# Patient Record
Sex: Female | Born: 2005 | Hispanic: Yes | Marital: Single | Smoking: Never smoker
Health system: Southern US, Community
[De-identification: ages and names within clinical notes are randomized; demographics above are authoritative.]

## PROBLEM LIST (undated history)

## (undated) DIAGNOSIS — J45909 Unspecified asthma, uncomplicated: Secondary | ICD-10-CM

---
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
## 2024-05-20 ENCOUNTER — Other Ambulatory Visit: Payer: Self-pay

## 2024-05-20 ENCOUNTER — Encounter (HOSPITAL_BASED_OUTPATIENT_CLINIC_OR_DEPARTMENT_OTHER): Payer: Self-pay

## 2024-05-20 DIAGNOSIS — Z5321 Procedure and treatment not carried out due to patient leaving prior to being seen by health care provider: Secondary | ICD-10-CM | POA: Insufficient documentation

## 2024-05-20 DIAGNOSIS — H5711 Ocular pain, right eye: Secondary | ICD-10-CM | POA: Insufficient documentation

## 2024-05-20 DIAGNOSIS — R519 Headache, unspecified: Secondary | ICD-10-CM | POA: Insufficient documentation

## 2024-05-20 MED ORDER — TETRACAINE HCL 0.5 % OP SOLN: 2.0000 [drp] | Freq: Once | OPHTHALMIC | Status: DC

## 2024-05-20 MED ORDER — FLUORESCEIN SODIUM 1 MG OP STRP: 1.0000 | ORAL_STRIP | Freq: Once | OPHTHALMIC | Status: DC

## 2024-05-20 NOTE — ED Triage Notes (Signed)
 Complains of R side of face and R eye pain starting this morning. Taken tylenol  without relief. States it feel like the pain of a headache but worse. Denies changes in vision, drainage, fevers, trauma to eye/ face.

## 2024-05-21 ENCOUNTER — Encounter (HOSPITAL_BASED_OUTPATIENT_CLINIC_OR_DEPARTMENT_OTHER): Payer: Self-pay | Admitting: Emergency Medicine

## 2024-05-21 ENCOUNTER — Emergency Department (HOSPITAL_BASED_OUTPATIENT_CLINIC_OR_DEPARTMENT_OTHER)
Admission: EM | Admit: 2024-05-21 | Discharge: 2024-05-21 | Discharge status: Against Medical Advice | Attending: Emergency Medicine | Admitting: Emergency Medicine

## 2024-05-21 ENCOUNTER — Emergency Department (HOSPITAL_COMMUNITY)
Admission: EM | Admit: 2024-05-21 | Discharge: 2024-05-21 | Disposition: A | Discharge status: Home / Self Care | Source: Home / Self Care | Attending: Emergency Medicine | Admitting: Emergency Medicine

## 2024-05-21 ENCOUNTER — Emergency Department (HOSPITAL_COMMUNITY)

## 2024-05-21 ENCOUNTER — Other Ambulatory Visit: Payer: Self-pay

## 2024-05-21 DIAGNOSIS — R519 Headache, unspecified: Secondary | ICD-10-CM | POA: Insufficient documentation

## 2024-05-21 DIAGNOSIS — Z5321 Procedure and treatment not carried out due to patient leaving prior to being seen by health care provider: Secondary | ICD-10-CM | POA: Diagnosis not present

## 2024-05-21 DIAGNOSIS — H5711 Ocular pain, right eye: Secondary | ICD-10-CM | POA: Diagnosis present

## 2024-05-21 DIAGNOSIS — H539 Unspecified visual disturbance: Secondary | ICD-10-CM | POA: Insufficient documentation

## 2024-05-21 MED ORDER — GADOBUTROL 1 MMOL/ML IV SOLN
4.5000 mL | Freq: Once | INTRAVENOUS | Status: AC | PRN
  Administered 2024-05-21: 4.5 mL via INTRAVENOUS

## 2024-05-21 MED ORDER — OXYCODONE-ACETAMINOPHEN 5-325 MG PO TABS
1.0000 | ORAL_TABLET | Freq: Once | ORAL | Status: DC
  Filled 2024-05-21: qty 1

## 2024-05-21 MED ORDER — PREDNISOLONE ACETATE 1 % OP SUSP: 1.0000 [drp] | Freq: Four times a day (QID) | OPHTHALMIC | 0 refills | Status: AC

## 2024-05-21 MED ORDER — FLUORESCEIN SODIUM 1 MG OP STRP
1.0000 | ORAL_STRIP | Freq: Once | OPHTHALMIC | Status: AC
  Administered 2024-05-21: 1 via OPHTHALMIC
  Filled 2024-05-21: qty 1

## 2024-05-21 MED ORDER — KETOROLAC TROMETHAMINE 30 MG/ML IJ SOLN
30.0000 mg | Freq: Once | INTRAMUSCULAR | Status: AC
  Administered 2024-05-21: 30 mg via INTRAMUSCULAR
  Filled 2024-05-21: qty 1

## 2024-05-21 MED ORDER — TETRACAINE HCL 0.5 % OP SOLN
1.0000 [drp] | Freq: Once | OPHTHALMIC | Status: AC
  Administered 2024-05-21: 1 [drp] via OPHTHALMIC
  Filled 2024-05-21: qty 4

## 2024-05-21 MED ORDER — LORAZEPAM 1 MG PO TABS: 0.5000 mg | ORAL_TABLET | ORAL | Status: DC | PRN

## 2024-05-21 MED ORDER — ACETAMINOPHEN 325 MG PO TABS
650.0000 mg | ORAL_TABLET | Freq: Once | ORAL | Status: AC
  Administered 2024-05-21: 650 mg via ORAL
  Filled 2024-05-21: qty 2

## 2024-05-21 NOTE — ED Notes (Signed)
 ED Provider at bedside.

## 2024-05-21 NOTE — ED Notes (Addendum)
 Pt told this RN that she would go POV to hospital. Verified w MD, pt is okay to go POV. [facility] notified. Pt given address for [name] and instructions for arrival. Educated to remain NPO and go straight to hospital. [facility] ED Charge RN called for update.

## 2024-05-21 NOTE — ED Notes (Signed)
 Patient transported to MRI

## 2024-05-21 NOTE — ED Notes (Signed)
 Report given to [place] charge from charge here at [facility]

## 2024-05-21 NOTE — ED Provider Notes (Signed)
 [place] EMERGENCY DEPARTMENT AT [facility] Provider Note   CSN: [id number] Arrival date & time: 05/21/24  0041     Patient presents with: Facial Pain   [name] is a 18 y.o. female.   HPI   18 year old female presenting to the emergency department with right-sided facial pain.  The patient has had pain to the right side of her face all day.  She denies any trauma to the face.  She denies any purulent discharge from her sinuses, denies any sinus pressure.  She denies any fevers or chills.  She denies headache initially, endorses mild headache in the setting of pain around her right eye.  She denies any rash.  She endorses blurry vision, denies double vision or vision loss.  She has had some tearing of the right eye with associated blurred vision.  No trauma to the eye. She endorses pain which feels to be behind the eye. Earlier she also had pain with extraocular movements.  Prior to Admission medications   Medication Sig Start Date End Date Taking? Authorizing Provider  ondansetron  (ZOFRAN  ODT) 4 MG disintegrating tablet Take 1 tablet (4 mg total) by mouth every 8 (eight) hours as needed. 01/08/20   Ettie Gull, MD  polyethylene glycol powder (GLYCOLAX /MIRALAX ) 17 GM/SCOOP powder 1 cap in 8 ounces daily 01/08/20   Ettie Gull, MD    Allergies: Patient has no known allergies.    Review of Systems  All other systems reviewed and are negative.   Updated Vital Signs BP (!) 97/55 (BP Location: Right Arm)   Pulse 63   Temp 98.1 F (36.7 C) (Oral)   Resp 16   Ht 5' 1 (1.549 m)   Wt 46.7 kg   LMP 05/20/2024 (Exact Date)   SpO2 100%   BMI 19.46 kg/m   Physical Exam Vitals and nursing note reviewed.  Constitutional:      General: She is not in acute distress.    Appearance: She is well-developed.  HENT:     Head: Normocephalic and atraumatic.     Right Ear: Tympanic membrane, ear canal and external ear normal.     Left Ear: Tympanic membrane, ear  canal and external ear normal.     Ears:     Comments: No evidence of vesicular lesions to the EAC on the right    Nose:     Comments: No facial rash Eyes:     General: Lids are normal. Vision grossly intact.        Right eye: No foreign body.        Left eye: No foreign body.     Extraocular Movements: Extraocular movements intact.     Conjunctiva/sclera: Conjunctivae normal.     Pupils:     Right eye: No corneal abrasion or fluorescein  uptake. Seidel exam negative.     Comments: No dendrites or pseudodendrites, no fluorescein  uptake  Cardiovascular:     Rate and Rhythm: Normal rate and regular rhythm.     Heart sounds: No murmur heard. Pulmonary:     Effort: Pulmonary effort is normal. No respiratory distress.     Breath sounds: Normal breath sounds.  Abdominal:     Palpations: Abdomen is soft.     Tenderness: There is no abdominal tenderness.  Musculoskeletal:        General: No swelling.     Cervical back: Normal range of motion and neck supple. No rigidity.  Skin:    General: Skin is warm and dry.  Capillary Refill: Capillary refill takes less than 2 seconds.  Neurological:     General: No focal deficit present.     Mental Status: She is alert and oriented to person, place, and time.     Cranial Nerves: No cranial nerve deficit.     Sensory: No sensory deficit.     Motor: No weakness.  Psychiatric:        Mood and Affect: Mood normal.     (all labs ordered are listed, but only abnormal results are displayed) Labs Reviewed - No data to display  EKG: None  Radiology: No results found.   Procedures   Medications Ordered in the ED  oxyCODONE -acetaminophen  (PERCOCET/ROXICET) 5-325 MG per tablet 1 tablet (1 tablet Oral Patient Refused/Not Given 05/21/24 0512)  LORazepam  (ATIVAN ) tablet 0.5 mg (has no administration in time range)  tetracaine  (PONTOCAINE) 0.5 % ophthalmic solution 1 drop (1 drop Right Eye Given by Other 05/21/24 0355)  fluorescein  ophthalmic  strip 1 strip (1 strip Right Eye Given by Other 05/21/24 0355)  ketorolac  (TORADOL ) 30 MG/ML injection 30 mg (30 mg Intramuscular Given 05/21/24 0509)  acetaminophen  (TYLENOL ) tablet 650 mg (650 mg Oral Given 05/21/24 0508)    Clinical Course as of 05/21/24 1516  Sat May 21, 2024  1125 IOP 19 b/l [JB]    Clinical Course User Index [JB] Barrett, Warren SAILOR, PA-C                                 Medical Decision Making Amount and/or Complexity of Data Reviewed Radiology: ordered.  Risk OTC drugs. Prescription drug management.    18 year old female presenting to the emergency department with right-sided facial pain.  The patient has had pain to the right side of her face all day.  She denies any trauma to the face.  She denies any purulent discharge from her sinuses, denies any sinus pressure.  She denies any fevers or chills.  She denies headache initially, endorses mild headache in the setting of pain around her right eye.  She denies any rash.  She endorses blurry vision, denies double vision or vision loss.  She has had some tearing of the right eye with associated blurred vision.  No trauma to the eye. She endorses pain which feels to be behind the eye. Earlier she also had pain with extraocular movements.  On arrival, the patient was afebrile, not tachycardic or tachypneic, BP 105/54, saturating her percent on room air.  Physical exam revealed a normal neurologic exam.  Patient with no evidence of rash.   Differential diagnosis includes optic neuritis, trigeminal neuralgia, herpes zoster ophthalmicus, cluster headache, less likely periorbital or orbital cellulitis.  Slit-lamp exam was performed after fluorescein  staining which revealed no evidence of dendrites or pseudo dendrites, no evidence of corneal abrasion. Unable to perform tonopen testing at [facility] due to newer tonopen currently nonfunctional.   The patient was administered Toradol , Tylenol  for pain control. Pt with 20/20 vision  bilaterally, endorses pain with EOMs and subjective blurry vision.  Spoke with Dr. Linzen of neuro who recommended repage if MRI shows concerning findings. Spoke with Dr. Regenia of ophthalmology who stated pt should undergo MRI of the orbits to eval for optic neuritis. If negative, treat for presumed iritis with prednisolone  QID until Monday follow-up in clinic. Dr. Nettie accepted the patient in ER to ER transfer.      Final diagnoses:  Facial pain, acute  Pain  of right eye    ED Discharge Orders     None          Jerrol Agent, MD 05/21/24 [phone]

## 2024-05-21 NOTE — ED Notes (Signed)
 Pt appears pleasant and in no apparent distress, on arrival to room frequently pt is found to be on the phone looking at the well lit screen with minimal pain expressed at this time.

## 2024-05-21 NOTE — ED Notes (Signed)
 Pt placed on 6lpm o2 on a nasal cannula to assist in relief of a cluster head ache per EDP

## 2024-05-21 NOTE — ED Provider Notes (Signed)
  Physical Exam  BP 97/71 (BP Location: Right Arm)   Pulse 76   Temp 98.3 F (36.8 C)   Resp 16   Ht 5' 1 (1.549 m)   Wt 46.7 kg   LMP 05/20/2024 (Exact Date)   SpO2 95%   BMI 19.46 kg/m   Physical Exam Eyes:     General: No allergic shiner, visual field deficit or scleral icterus.       Right eye: No foreign body or discharge.        Left eye: No foreign body or discharge.     Intraocular pressure: Right eye pressure is 19 mmHg. Left eye pressure is 19 mmHg.     Extraocular Movements:     Right eye: Normal extraocular motion and no nystagmus.     Left eye: Normal extraocular motion and no nystagmus.     Comments: PUPILS equal and reactive.   Cardiovascular:     Rate and Rhythm: Normal rate and regular rhythm.  Pulmonary:     Effort: Pulmonary effort is normal.     Breath sounds: Normal breath sounds.  Abdominal:     General: Abdomen is flat. Bowel sounds are normal.     Palpations: Abdomen is soft.     Procedures  Procedures  ED Course / MDM   Clinical Course as of 05/21/24 1125  Sat May 21, 2024  1125 IOP 19 b/l [JB]    Clinical Course User Index [JB] Aailyah Dunbar, Warren SAILOR, PA-C   Medical Decision Making Amount and/or Complexity of Data Reviewed Radiology: ordered.  Risk OTC drugs. Prescription drug management.   Patient from [facility]. Presenting with eye pain. Here for MR of orbits. I will check IOP due to equipment error at prior facility.   Patient reporting the she had right sided eye and forehead pain that is much better, but still present. She was having blurry vision and pain with eye ROM, but she reports this has resolved. Vitals stable here, well appearing.   MRI negative. Since MRI is negative we will treat as presumed iritis with prednisolone  until Monday as suggested by Dr dan note.  Hemodynamically stable and well-appearing.  Feel appropriate for discharge with outpatient follow-up.       Shermon Warren SAILOR DEVONNA 05/21/24 1130     Garrick Charleston, MD 05/21/24 1538

## 2024-05-21 NOTE — ED Notes (Signed)
 [facility] called for transport.

## 2024-05-21 NOTE — Discharge Instructions (Addendum)
 Your workup and MRI is reassuring. I would recommend picking up eye drops and following up with Eye Doctor. Please go on Monday.

## 2024-05-21 NOTE — ED Triage Notes (Signed)
 Patient c/o right side facial and right eye pain all day.  Denies traumas, fevers, changes in vision, and drainage.

## 2024-05-21 NOTE — ED Notes (Signed)
 PT given discharge instructions and verbalized understanding. Pt was given the opportunity to ask questions If needed.

## 2024-05-21 NOTE — ED Notes (Signed)
 Pt noted to being watching videos on her phone.  NAD noted.
